# Patient Record
Sex: Male | Born: 2005 | Race: White | Hispanic: No | Marital: Single | Smoking: Never smoker
Health system: Southern US, Community
[De-identification: ages and names within clinical notes are randomized; demographics above are authoritative.]

---
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
## 2023-09-26 ENCOUNTER — Encounter: Payer: Self-pay | Admitting: Student in an Organized Health Care Education/Training Program

## 2023-09-26 ENCOUNTER — Ambulatory Visit (INDEPENDENT_AMBULATORY_CARE_PROVIDER_SITE_OTHER): Admitting: Student in an Organized Health Care Education/Training Program

## 2023-09-26 VITALS — BP 108/52 | HR 64 | Ht 73.0 in | Wt 156.0 lb

## 2023-09-26 DIAGNOSIS — H6121 Impacted cerumen, right ear: Secondary | ICD-10-CM | POA: Diagnosis not present

## 2023-09-26 DIAGNOSIS — F4321 Adjustment disorder with depressed mood: Secondary | ICD-10-CM

## 2023-09-26 DIAGNOSIS — L709 Acne, unspecified: Secondary | ICD-10-CM | POA: Diagnosis not present

## 2023-09-26 DIAGNOSIS — F4320 Adjustment disorder, unspecified: Secondary | ICD-10-CM | POA: Insufficient documentation

## 2023-09-26 DIAGNOSIS — H6120 Impacted cerumen, unspecified ear: Secondary | ICD-10-CM | POA: Insufficient documentation

## 2023-09-26 MED ORDER — TRETINOIN 0.1 % EX CREA: TOPICAL_CREAM | Freq: Every day | CUTANEOUS | 2 refills | Status: DC

## 2023-09-26 MED ORDER — SERTRALINE HCL 50 MG PO TABS: 50.0000 mg | ORAL_TABLET | Freq: Every day | ORAL | 3 refills | Status: DC

## 2023-09-26 NOTE — Patient Instructions (Signed)
  VISIT SUMMARY: Today, you were seen for persistent acne and excessive sleepiness. We also discussed your back discomfort and ear blockage issues. You have been experiencing acne since tenth grade, and over-the-counter treatments have not been effective. You also reported feeling excessively sleepy during the day despite getting 8 hours of sleep at night. Additionally, you mentioned back discomfort related to posture and an incident of ear blockage with new earbuds.  YOUR PLAN: -ACNE VULGARIS: Acne vulgaris is a common skin condition that occurs when hair follicles become clogged with oil and dead skin cells, leading to pimples, blackheads, and whiteheads. We will start you on a prescription topical medication called tretinoin . If there is no improvement, we may refer you to a dermatologist.  -EXCESSIVE DAYTIME SLEEPINESS: Excessive daytime sleepiness means feeling very sleepy during the day even after getting enough sleep at night. This could be related to mood issues. We will start you on sertraline  at 25 mg for the first week. Additionally, maintaining good sleep hygiene and a consistent sleep schedule of 9-10 hours per night is recommended.  -CHRONIC BACK PAIN: Chronic back pain is ongoing pain in the back that can be related to posture or prolonged standing. Your pain is not severe and does not limit your activities. No specific treatment is needed at this time, but maintaining good posture and being mindful of your activities can help manage the discomfort.  -IMPACTED EARWAX: Impacted earwax occurs when earwax builds up in the ear canal, causing hearing issues. We performed ear irrigation on your right ear to remove the earwax. To prevent this in the future, avoid using in-ear earbuds and consider using over-the-ear headphones instead.  INSTRUCTIONS: Please follow up with us  if your acne does not improve with the topical tretinoin , or if you experience any side effects from the sertraline .  Maintain a consistent sleep schedule and practice good sleep hygiene. If your back pain worsens or becomes severe, please let us  know. Avoid using in-ear earbuds to prevent earwax buildup.

## 2023-09-26 NOTE — Assessment & Plan Note (Signed)
 Chronic comedonal acne with small cysts, no deep scarring. Previous treatments ineffective with benzyl peroxide and topical azelaic acid. Discussed topical tretinoin  and potential oral retinoids. - Prescribe topical tretinoin .  Apply to clean skin nightly before bed

## 2023-09-26 NOTE — Progress Notes (Signed)
 New Patient Office Visit  Subjective    Patient ID: Douglas Crawford, male    DOB: 2005-05-03  Age: 18 y.o. MRN: [id number]  CC:  Chief Complaint  Patient presents with   Establish Care    HPI  Discussed the use of AI scribe software for clinical note transcription with the patient, who gave verbal consent to proceed.  History of Present Illness Douglas Crawford is a 18 year old male who presents with persistent acne and excessive sleepiness.  He has experienced persistent acne since tenth grade, unresponsive to over-the-counter treatments like benzoyl peroxide and salicylic acid. Currently, he uses CeraVe acne wash without improvement. He has not tried prescription medications for acne.  He experiences excessive sleepiness and difficulty waking up in the morning despite 8 hours of sleep per night. He feels the need to sleep from 8 AM to 10 AM and struggles to stay awake during the day, even while driving. These symptoms began in eleventh grade. He uses energy drinks to cope but does not feel well-rested and reports a lack of motivation, affecting his attendance in morning classes.  He has a history of depressive symptoms, including past suicidal thoughts, but does not currently feel depressed. He lacks interest in activities and has difficulty initiating tasks, although he enjoys them once engaged. He has not sought treatment for depression due to his parents' reluctance to consider medication.  He is concerned about his posture and experiences back discomfort attributed to slouching since middle school. 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 He recalls an incident of acute back pain after bending over, which resolved after two days.  No tobacco, alcohol, or drug use. He experienced ear blockage with new earbuds, which resolved after discontinuing their use. 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 He has a history of being sedentary due to his previous major in computer science but is attempting to be more active this summer. No shortness of breath, fatigue, or waking up with a stiff back. Occasional back cracking and popping, but no severe pain. No issues with eating or drinking, although he has noticed a recent weight loss of about five pounds. No current depressive symptoms but acknowledges past depressive episodes.   Outpatient Encounter Medications as of 09/26/2023  Medication Sig   sertraline  (ZOLOFT ) 50 MG tablet Take 1 tablet (50 mg total) by mouth daily.   tretinoin  (RETIN-A ) 0.1 % cream Apply topically at bedtime.   [DISCONTINUED] calcium carbonate (TUMS - DOSED IN MG ELEMENTAL CALCIUM) 500 MG chewable tablet Chew 2 tablets by mouth daily as needed for indigestion or heartburn. (Patient not taking: Reported on 09/26/2023)   [DISCONTINUED] cetirizine (ZYRTEC) 10 MG tablet Take 10 mg by mouth daily. (Patient not taking: Reported on 09/26/2023)   [DISCONTINUED] dicyclomine  (BENTYL ) 10 MG/5ML syrup Take 2.5 mLs (5 mg total) by mouth 3 (three) times daily as needed. For abdominal cramping (Patient not taking: Reported on 09/26/2023)   [DISCONTINUED] ibuprofen (ADVIL,MOTRIN) 200 MG tablet Take 200 mg by mouth every 6 (six) hours as needed for mild pain. (Patient not taking: Reported on 09/26/2023)   [DISCONTINUED] ondansetron  (ZOFRAN  ODT) 4 MG disintegrating tablet Take 1 tablet (4 mg total) by mouth every 8 (eight) hours as needed. (Patient not taking: Reported on 09/26/2023)   No facility-administered encounter medications  on file as of 09/26/2023.    Family History  Problem Relation Age of Onset   Heart disease Father    Diabetes Maternal Grandfather         Objective    BP (!) 108/52   Pulse 64   Ht 6' 1 (1.854 m)   Wt 156 lb (70.8 kg)   SpO2 99%   BMI 20.58 kg/m   Physical Exam  Physical Exam Gen:  Well-appearing young man Eyes: Normal Skin: Moderate burden of comedonal acne along his face and forehead, very few cysts, no scars HEENT: Right ear with cerumen impaction.  We remove the impaction with a curette and then with irrigation to remove the remaining cerumen off the ear drum.  Your canal had inflammation.  Tympanic membranes were intact bilaterally. NECK: Thyroid normal.  No adenopathy. CARDIOVASCULAR: Heart regular rate and rhythm, no murmurs. Lungs: Unlabored, clear throughout Abd: Soft, nontender, no organomegaly MUSCULOSKELETAL: Spine straight, no scoliosis.  Joints are normal, no edema of the lower extremities Psych: Appropriate mood and affect, not anxious or depressed appearing, normal speech, linear thoughts      Assessment & Plan:    Problem List Items Addressed This Visit       High   Adjustment disorder   Excessive sleepiness despite adequate sleep. Possible mood-related etiology. Discussed sertraline , noting activating effects and potential side effects.  Depression type symptoms seem mild, seems consistent with probably an adjustment disorder. - Prescribe sertraline  25 mg for the first week then increase to 50 mg daily. - Advise on sleep hygiene and consistent 9-10 hour sleep schedule.      Relevant Medications   sertraline  (ZOLOFT ) 50 MG tablet     Medium    Acne - Primary (Chronic)   Chronic comedonal acne with small cysts, no deep scarring. Previous treatments ineffective with benzyl peroxide and topical azelaic acid. Discussed topical tretinoin  and potential oral retinoids. - Prescribe topical tretinoin .  Apply to clean skin nightly before bed      Relevant Medications   tretinoin  (RETIN-A ) 0.1 % cream     Low   Cerumen impaction   Procedure Note: Manual Removal of Impacted Cerumen Using a Curette   Indication:  Cerumen impaction causing symptoms (e.g., hearing loss, pain, tinnitus) or preventing assessment of the ear canal and tympanic  membrane.  Procedure:  Explained the procedure to the patient and informed consent was obtained  Review patient history for contraindications (e.g., nonintact tympanic membrane, history of ear surgery, anatomical abnormalities).  After a position of the patient's head upright, I visualized the ear canal and cerumen using an otoscope.  I gently inserted the curette into the ear canal avoiding contact with the canal walls, and carefully scooped the cerumen, removing it in small pieces.  I reassessed the ear canal and tympanic membrane, there was no residual cerumen nor signs of trauma.  Follow-Up:  I instructed the patient to report any persistent symptoms such as pain, discharge, or hearing loss.  Schedule a follow-up appointment if necessary.        Return in about 4 weeks (around 10/24/2023).   Cleatus Debby Specking, MD

## 2023-09-26 NOTE — Assessment & Plan Note (Signed)
 Excessive sleepiness despite adequate sleep. Possible mood-related etiology. Discussed sertraline , noting activating effects and potential side effects.  Depression type symptoms seem mild, seems consistent with probably an adjustment disorder. - Prescribe sertraline  25 mg for the first week then increase to 50 mg daily. - Advise on sleep hygiene and consistent 9-10 hour sleep schedule.

## 2023-09-26 NOTE — Assessment & Plan Note (Signed)
 Procedure Note: Manual Removal of Impacted Cerumen Using a Curette   Indication:  Cerumen impaction causing symptoms (e.g., hearing loss, pain, tinnitus) or preventing assessment of the ear canal and tympanic membrane.  Procedure:  Explained the procedure to the patient and informed consent was obtained  Review patient history for contraindications (e.g., nonintact tympanic membrane, history of ear surgery, anatomical abnormalities).  After a position of the patient's head upright, I visualized the ear canal and cerumen using an otoscope.  I gently inserted the curette into the ear canal avoiding contact with the canal walls, and carefully scooped the cerumen, removing it in small pieces.  I reassessed the ear canal and tympanic membrane, there was no residual cerumen nor signs of trauma.  Follow-Up:   I instructed the patient to report any persistent symptoms such as pain, discharge, or hearing loss.  Schedule a follow-up appointment if necessary.

## 2023-10-24 ENCOUNTER — Ambulatory Visit (INDEPENDENT_AMBULATORY_CARE_PROVIDER_SITE_OTHER): Admitting: Student in an Organized Health Care Education/Training Program

## 2023-10-24 ENCOUNTER — Encounter: Payer: Self-pay | Admitting: Student in an Organized Health Care Education/Training Program

## 2023-10-24 DIAGNOSIS — L709 Acne, unspecified: Secondary | ICD-10-CM | POA: Diagnosis not present

## 2023-10-24 DIAGNOSIS — F4321 Adjustment disorder with depressed mood: Secondary | ICD-10-CM

## 2023-10-24 MED ORDER — TRETINOIN 0.1 % EX CREA: TOPICAL_CREAM | Freq: Every day | CUTANEOUS | 2 refills | Status: AC

## 2023-10-24 MED ORDER — SERTRALINE HCL 50 MG PO TABS: 50.0000 mg | ORAL_TABLET | Freq: Every day | ORAL | 3 refills | Status: AC

## 2023-10-24 NOTE — Patient Instructions (Signed)
  VISIT SUMMARY: Today, we discussed your progress with mood and acne management. You reported feeling much better with your current medication for mood and noted some improvement in your acne condition.  YOUR PLAN: -DEPRESSIVE DISORDER: Depressive disorder is a condition characterized by persistent feelings of sadness and loss of interest. Your symptoms have improved with sertraline  50 mg daily, and you should continue taking it as prescribed. We discussed the possibility of tapering off the medication in the future under supervision.  -ACNE: Acne is a skin condition that occurs when hair follicles become clogged with oil and dead skin cells. Your acne has shown some improvement with the use of tretinoin . Continue applying tretinoin  0.1% at bedtime, and you may reduce the frequency to 2-3 times per week once your acne resolves. We also discussed a long-term plan for discontinuation once your acne is fully resolved.  INSTRUCTIONS: Please continue taking sertraline  50 mg daily and applying tretinoin  0.1% at bedtime. Follow up in 3 months to review your progress and discuss any necessary adjustments to your treatment plan.

## 2023-10-24 NOTE — Assessment & Plan Note (Signed)
 Chronic, much improved.  Symptomatically doing very well on sertraline  50 mg daily.  Mood symptoms are improving.  No side effects.  Will continue with sertraline  50 mg daily indefinitely.  Refill sent.

## 2023-10-24 NOTE — Progress Notes (Signed)
   Established Patient Office Visit  Subjective   Patient ID: Douglas Crawford, male    DOB: 2005/05/06  Age: 18 y.o. MRN: [id number]  Chief Complaint  Patient presents with   Medical Management of Chronic Issues    4 week follow up from appointment on 7/08     HPI  Discussed the use of AI scribe software for clinical note transcription with the patient, who gave verbal consent to proceed.  History of Present Illness Douglas Crawford is a 18 year old male who presents for follow-up of mood and acne management.  He has experienced an improvement in mood since starting sertraline , feeling good most of the week compared to previously feeling good only once every two weeks. He is currently taking sertraline  50 mg once daily and denies any significant side effects. The medication has positively impacted his work performance, allowing him to engage more with customers. No hearing issues or significant side effects from sertraline  are reported.  He is using tretinoin  for acne treatment and reports experiencing dryness and occasional worsening of acne, but notes some improvement today. He has been consistent with the application, only skipping a few days due to dryness. He currently uses a 45 gram tube of tretinoin  and has used about 60-70% of it over the past month.  He is preparing to return to school at 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 He has a car on campus, which he did not have during his freshman year. He works a minimum wage job at [employer] and feels that the medication has helped him be more communicative at work.     Objective:     BP (!) 106/57   Pulse 67   Wt 155 lb (70.3 kg)   BMI 20.45 kg/m    Physical Exam   Gen: Well-appearing young man Psych: Appropriate mood and affect, not anxious or depressed appearing Skin: Moderate comedonal and cystic acne is improving, base is more dried today, no active cysts Heart: Regular, no  murmur    Assessment & Plan:   Problem List Items Addressed This Visit       High   Adjustment disorder (Chronic)   Chronic, much improved.  Symptomatically doing very well on sertraline  50 mg daily.  Mood symptoms are improving.  No side effects.  Will continue with sertraline  50 mg daily indefinitely.  Refill sent.      Relevant Medications   sertraline  (ZOLOFT ) 50 MG tablet     Medium    Acne (Chronic)   Chronic and improving.  Mix of cystic and comedonal acne.  Improving with topical tretinoin .  A little drying but not much other side effect.  He is tolerating it well on a near daily basis.  Will plan to continue this, refill sent.      Relevant Medications   tretinoin  (RETIN-A ) 0.1 % cream    Return in about 5 months (around 03/25/2024).    Cleatus Debby Specking, MD

## 2023-10-24 NOTE — Assessment & Plan Note (Signed)
 Chronic and improving.  Mix of cystic and comedonal acne.  Improving with topical tretinoin .  A little drying but not much other side effect.  He is tolerating it well on a near daily basis.  Will plan to continue this, refill sent.

## 2024-03-18 ENCOUNTER — Encounter: Payer: Self-pay | Admitting: Student in an Organized Health Care Education/Training Program

## 2024-03-18 ENCOUNTER — Ambulatory Visit (INDEPENDENT_AMBULATORY_CARE_PROVIDER_SITE_OTHER): Admitting: Student in an Organized Health Care Education/Training Program

## 2024-03-18 VITALS — BP 108/57 | HR 77 | Wt 168.0 lb

## 2024-03-18 DIAGNOSIS — R631 Polydipsia: Secondary | ICD-10-CM

## 2024-03-18 DIAGNOSIS — F4321 Adjustment disorder with depressed mood: Secondary | ICD-10-CM

## 2024-03-18 DIAGNOSIS — L709 Acne, unspecified: Secondary | ICD-10-CM | POA: Diagnosis not present

## 2024-03-18 DIAGNOSIS — G252 Other specified forms of tremor: Secondary | ICD-10-CM | POA: Diagnosis not present

## 2024-03-18 LAB — URINALYSIS, ROUTINE W REFLEX MICROSCOPIC
Hgb urine dipstick: NEGATIVE
Leukocytes,Ua: NEGATIVE
Nitrite: NEGATIVE
RBC / HPF: NONE SEEN
Specific Gravity, Urine: 1.025 (ref 1.000–1.030)
Urine Glucose: NEGATIVE
Urobilinogen, UA: 0.2 (ref 0.0–1.0)
pH: 6.5 (ref 5.0–8.0)

## 2024-03-18 LAB — TSH: TSH: 1.22 u[IU]/mL (ref 0.35–5.50)

## 2024-03-18 LAB — BASIC METABOLIC PANEL WITH GFR
BUN: 9 mg/dL (ref 6–23)
CO2: 30 meq/L (ref 19–32)
Calcium: 9.4 mg/dL (ref 8.4–10.5)
Chloride: 101 meq/L (ref 96–112)
Creatinine, Ser: 0.86 mg/dL (ref 0.40–1.50)
GFR: 124.81 mL/min
Glucose, Bld: 85 mg/dL (ref 70–99)
Potassium: 4.4 meq/L (ref 3.5–5.1)
Sodium: 140 meq/L (ref 135–145)

## 2024-03-18 LAB — HEMOGLOBIN A1C: Hgb A1c MFr Bld: 4.9 % (ref 4.6–6.5)

## 2024-03-18 NOTE — Assessment & Plan Note (Signed)
 Mild intention tremor but not life-limiting.  Neuroexam is otherwise normal.  Probably a normal physiologic tremor.  Will check a TSH to rule out hyperthyroidism.

## 2024-03-18 NOTE — Patient Instructions (Signed)
" °  VISIT SUMMARY: During your visit, we discussed your increased thirst, mood changes after stopping Zoloft , and ongoing acne treatment. We reviewed your symptoms and family history, and we have a plan to address each of your concerns.  YOUR PLAN: -POLYDIPSIA: Polydipsia means excessive thirst. Given your family history of diabetes and your symptoms, we will check your blood glucose levels, kidney function, and urine concentration to determine the cause of your increased thirst.  -ADJUSTMENT DISORDER: Adjustment disorder is a stress-related condition that can occur after stopping a medication like Zoloft . To help improve your mood and motivation, you should resume taking sertraline  (Zoloft ) 50 mg daily. Continue this for six months, and we will reassess your condition then.  -ACNE VULGARIS: Acne vulgaris is a common skin condition that causes pimples and cysts. You should continue using tretinoin  0.1% cream almost daily, but balance it with moisturizing to manage dryness.  INSTRUCTIONS: Please follow up in six months to reassess your mood and the effectiveness of sertraline . Additionally, we will review the results of your blood glucose, kidney function, and urine tests once they are available.  "

## 2024-03-18 NOTE — Assessment & Plan Note (Signed)
 Improvement noted, but dryness from tretinoin  use persists. Cystic lesions remain. Discussed treatment balance with skin dryness. Continue tretinoin  0.1% topically near daily. Advised on balancing acne treatment with skin hydration.

## 2024-03-18 NOTE — Assessment & Plan Note (Signed)
 He experiences increased thirst and clear urine, with a family history of diabetes. The differential includes diabetes mellitus, insipidus and other causes. Ordered blood glucose test, kidney function tests, and urine analysis to assess concentration.

## 2024-03-18 NOTE — Assessment & Plan Note (Signed)
 Discontinuation of sertraline  led to decreased mood and motivation. Sertraline  discussed as supportive treatment, not lifelong. Advised against frequent on-and-off use. Resume sertraline  50 mg orally daily. Continue for six months, then reassess.

## 2024-03-18 NOTE — Progress Notes (Signed)
 "  Established Patient Office Visit  Patient ID: Douglas Crawford, male    DOB: 2005-09-02  Age: 18 y.o. MRN: [id number] PCP: Jerrell Cleatus Ned, MD  Chief Complaint  Patient presents with   Medical Management of Chronic Issues    5 month follow up     Subjective:     HPI  Discussed the use of AI scribe software for clinical note transcription with the patient, who gave verbal consent to proceed.  History of Present Illness Douglas Crawford is a 18 year old male who presents with increased thirst and concerns about potential diabetes.  He experiences increased thirst despite consuming approximately 70 to 80 ounces of water daily. His urine remains clear, yet he feels dehydrated and has a dry mouth. Occasionally, he experiences lightheadedness upon standing, accompanied by a fuzzy sensation. He also notes a noticeable hand tremor when extending his hand, though it does not interfere with daily activities. No heat intolerance or excessive sweating.  He has a family history of diabetes, which heightens his concern about his symptoms potentially indicating diabetes.  He discontinued Zoloft  at the start of December and has noticed a decline in mood and motivation, particularly in the past week. He had been taking Zoloft  consistently from the summer until early December, during which he observed improvements in activity, eating habits, and sleep.  Regarding acne, he has been using acne face wash and tretinoin  cream four to five times a week but reduced usage since mid-December due to dryness. He reports persistent facial cysts that appear and resolve in cycles. He occasionally uses face lotion to manage dryness.  He mentions gaining weight, which he perceives as positive, and engages in a reasonable amount of exercise. He does not consume alcohol and does not feel the need for STI testing at this time.     Objective:     BP (!) 108/57   Pulse 77   Wt 168 lb (76.2 kg)   SpO2  98%   BMI 22.16 kg/m   Physical Exam  Gen: Well-appearing young man Skin: Mild to moderate acne, a few comedones, only 1 cyst Heart: Regular, no murmur Lungs: Unlabored, clear throughout Ext: Warm, no edema    Assessment & Plan:   Problem List Items Addressed This Visit       High   Adjustment disorder (Chronic)   Discontinuation of sertraline  led to decreased mood and motivation. Sertraline  discussed as supportive treatment, not lifelong. Advised against frequent on-and-off use. Resume sertraline  50 mg orally daily. Continue for six months, then reassess.        Medium    Acne (Chronic)   Improvement noted, but dryness from tretinoin  use persists. Cystic lesions remain. Discussed treatment balance with skin dryness. Continue tretinoin  0.1% topically near daily. Advised on balancing acne treatment with skin hydration.        Unprioritized   Polydipsia - Primary   He experiences increased thirst and clear urine, with a family history of diabetes. The differential includes diabetes mellitus, insipidus and other causes. Ordered blood glucose test, kidney function tests, and urine analysis to assess concentration.      Relevant Orders   Basic metabolic panel with GFR   Hemoglobin A1c   Urinalysis, Routine w reflex microscopic   Intention tremor   Mild intention tremor but not life-limiting.  Neuroexam is otherwise normal.  Probably a normal physiologic tremor.  Will check a TSH to rule out hyperthyroidism.      Relevant Orders  TSH     Return in about 6 months (around 09/16/2024).    Cleatus Debby Specking, MD [facility]   "

## 2024-03-19 ENCOUNTER — Ambulatory Visit: Payer: Self-pay | Admitting: Student in an Organized Health Care Education/Training Program

## 2024-09-23 ENCOUNTER — Ambulatory Visit: Admitting: Student in an Organized Health Care Education/Training Program
